# Patient Record
Sex: Female | Born: 1970 | Race: Black or African American | Hispanic: No | Smoking: Never smoker
Health system: Southern US, Community
[De-identification: ages and names within clinical notes are randomized; demographics above are authoritative.]

## PROBLEM LIST (undated history)

## (undated) DIAGNOSIS — J45909 Unspecified asthma, uncomplicated: Secondary | ICD-10-CM

## (undated) DIAGNOSIS — D649 Anemia, unspecified: Secondary | ICD-10-CM

## (undated) HISTORY — PX: BREAST SURGERY: SHX581

## (undated) HISTORY — PX: ABDOMINAL HYSTERECTOMY: SHX81

---

## 1997-06-08 ENCOUNTER — Inpatient Hospital Stay (HOSPITAL_COMMUNITY): Admission: AD | Admit: 1997-06-08 | Discharge: 1997-06-08 | Payer: Self-pay | Admitting: Obstetrics and Gynecology

## 1997-06-09 ENCOUNTER — Inpatient Hospital Stay (HOSPITAL_COMMUNITY): Admission: AD | Admit: 1997-06-09 | Discharge: 1997-06-09 | Payer: Self-pay | Admitting: Obstetrics and Gynecology

## 1997-06-26 ENCOUNTER — Inpatient Hospital Stay (HOSPITAL_COMMUNITY): Admission: AD | Admit: 1997-06-26 | Discharge: 1997-06-26 | Payer: Self-pay | Admitting: Obstetrics & Gynecology

## 1997-06-30 ENCOUNTER — Inpatient Hospital Stay (HOSPITAL_COMMUNITY): Admission: AD | Admit: 1997-06-30 | Discharge: 1997-07-02 | Payer: Self-pay | Admitting: Obstetrics and Gynecology

## 1997-07-09 ENCOUNTER — Observation Stay (HOSPITAL_COMMUNITY): Admission: AD | Admit: 1997-07-09 | Discharge: 1997-07-10 | Payer: Self-pay | Admitting: Obstetrics and Gynecology

## 1997-07-11 ENCOUNTER — Inpatient Hospital Stay (HOSPITAL_COMMUNITY): Admission: AD | Admit: 1997-07-11 | Discharge: 1997-07-21 | Payer: Self-pay | Admitting: Obstetrics & Gynecology

## 2013-04-09 ENCOUNTER — Emergency Department (HOSPITAL_COMMUNITY): Payer: No Typology Code available for payment source

## 2013-04-09 ENCOUNTER — Emergency Department (HOSPITAL_COMMUNITY)
Admission: EM | Admit: 2013-04-09 | Discharge: 2013-04-09 | Disposition: A | Discharge status: Home / Self Care | Payer: No Typology Code available for payment source | Attending: Emergency Medicine | Admitting: Emergency Medicine

## 2013-04-09 ENCOUNTER — Encounter (HOSPITAL_COMMUNITY): Payer: Self-pay | Admitting: Emergency Medicine

## 2013-04-09 DIAGNOSIS — S39012A Strain of muscle, fascia and tendon of lower back, initial encounter: Secondary | ICD-10-CM

## 2013-04-09 DIAGNOSIS — D649 Anemia, unspecified: Secondary | ICD-10-CM | POA: Insufficient documentation

## 2013-04-09 DIAGNOSIS — J45909 Unspecified asthma, uncomplicated: Secondary | ICD-10-CM | POA: Insufficient documentation

## 2013-04-09 DIAGNOSIS — Z79899 Other long term (current) drug therapy: Secondary | ICD-10-CM | POA: Insufficient documentation

## 2013-04-09 DIAGNOSIS — S139XXA Sprain of joints and ligaments of unspecified parts of neck, initial encounter: Secondary | ICD-10-CM | POA: Insufficient documentation

## 2013-04-09 DIAGNOSIS — Y92410 Unspecified street and highway as the place of occurrence of the external cause: Secondary | ICD-10-CM | POA: Insufficient documentation

## 2013-04-09 DIAGNOSIS — IMO0002 Reserved for concepts with insufficient information to code with codable children: Secondary | ICD-10-CM | POA: Insufficient documentation

## 2013-04-09 DIAGNOSIS — Y9389 Activity, other specified: Secondary | ICD-10-CM | POA: Insufficient documentation

## 2013-04-09 DIAGNOSIS — S0990XA Unspecified injury of head, initial encounter: Secondary | ICD-10-CM | POA: Insufficient documentation

## 2013-04-09 DIAGNOSIS — S161XXA Strain of muscle, fascia and tendon at neck level, initial encounter: Secondary | ICD-10-CM

## 2013-04-09 HISTORY — DX: Unspecified asthma, uncomplicated: J45.909

## 2013-04-09 HISTORY — DX: Anemia, unspecified: D64.9

## 2013-04-09 MED ORDER — DIAZEPAM 5 MG PO TABS
5.0000 mg | ORAL_TABLET | Freq: Once | ORAL | Status: AC
  Administered 2013-04-09: 5 mg via ORAL
  Filled 2013-04-09: qty 1

## 2013-04-09 MED ORDER — HYDROCODONE-ACETAMINOPHEN 5-325 MG PO TABS: 1.0000 | ORAL_TABLET | ORAL | Status: DC | PRN

## 2013-04-09 MED ORDER — KETOROLAC TROMETHAMINE 60 MG/2ML IM SOLN
60.0000 mg | Freq: Once | INTRAMUSCULAR | Status: AC
  Administered 2013-04-09: 60 mg via INTRAMUSCULAR
  Filled 2013-04-09: qty 2

## 2013-04-09 MED ORDER — DIAZEPAM 5 MG PO TABS: 5.0000 mg | ORAL_TABLET | Freq: Two times a day (BID) | ORAL | Status: DC

## 2013-04-09 NOTE — ED Notes (Signed)
Patient remains in Xray/CT still.

## 2013-04-09 NOTE — ED Notes (Signed)
Patient wants to hold off on Toradol at this time.

## 2013-04-09 NOTE — ED Notes (Signed)
Patient transported to X-ray 

## 2013-04-09 NOTE — ED Notes (Signed)
Patient returned from X-ray 
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
## 2013-04-10 NOTE — ED Provider Notes (Signed)
Medical screening examination/treatment/procedure(s) were performed by non-physician practitioner and as supervising physician I was immediately available for consultation/collaboration.  EKG Interpretation   None         Layla MawKristen N Madia Carvell, DO 04/10/13 [phone]

## 2021-03-10 ENCOUNTER — Ambulatory Visit: Payer: BC Managed Care – PPO | Admitting: Podiatry

## 2021-04-13 ENCOUNTER — Emergency Department (HOSPITAL_COMMUNITY): Payer: BC Managed Care – PPO

## 2021-04-13 ENCOUNTER — Emergency Department (HOSPITAL_COMMUNITY)
Admission: EM | Admit: 2021-04-13 | Discharge: 2021-04-13 | Disposition: A | Discharge status: Home / Self Care | Payer: BC Managed Care – PPO | Attending: Emergency Medicine | Admitting: Emergency Medicine

## 2021-04-13 ENCOUNTER — Other Ambulatory Visit: Payer: Self-pay

## 2021-04-13 DIAGNOSIS — Z91010 Allergy to peanuts: Secondary | ICD-10-CM | POA: Insufficient documentation

## 2021-04-13 DIAGNOSIS — H5710 Ocular pain, unspecified eye: Secondary | ICD-10-CM | POA: Diagnosis not present

## 2021-04-13 DIAGNOSIS — R519 Headache, unspecified: Secondary | ICD-10-CM | POA: Insufficient documentation

## 2021-04-13 DIAGNOSIS — J45909 Unspecified asthma, uncomplicated: Secondary | ICD-10-CM | POA: Insufficient documentation

## 2021-04-13 DIAGNOSIS — Z79899 Other long term (current) drug therapy: Secondary | ICD-10-CM | POA: Diagnosis not present

## 2021-04-13 LAB — CBC WITH DIFFERENTIAL/PLATELET
Abs Immature Granulocytes: 0.01 10*3/uL (ref 0.00–0.07)
Basophils Absolute: 0 10*3/uL (ref 0.0–0.1)
Basophils Relative: 1 %
Eosinophils Absolute: 0.1 10*3/uL (ref 0.0–0.5)
Eosinophils Relative: 2 %
HCT: 44.9 % (ref 36.0–46.0)
Hemoglobin: 14.1 g/dL (ref 12.0–15.0)
Immature Granulocytes: 0 %
Lymphocytes Relative: 50 %
Lymphs Abs: 3 10*3/uL (ref 0.7–4.0)
MCH: 29 pg (ref 26.0–34.0)
MCHC: 31.4 g/dL (ref 30.0–36.0)
MCV: 92.4 fL (ref 80.0–100.0)
Monocytes Absolute: 0.3 10*3/uL (ref 0.1–1.0)
Monocytes Relative: 6 %
Neutro Abs: 2.4 10*3/uL (ref 1.7–7.7)
Neutrophils Relative %: 41 %
Platelets: 367 10*3/uL (ref 150–400)
RBC: 4.86 MIL/uL (ref 3.87–5.11)
RDW: 13.2 % (ref 11.5–15.5)
WBC: 5.9 10*3/uL (ref 4.0–10.5)
nRBC: 0 % (ref 0.0–0.2)

## 2021-04-13 LAB — BASIC METABOLIC PANEL
Anion gap: 8 (ref 5–15)
BUN: 15 mg/dL (ref 6–20)
CO2: 26 mmol/L (ref 22–32)
Calcium: 9.2 mg/dL (ref 8.9–10.3)
Chloride: 103 mmol/L (ref 98–111)
Creatinine, Ser: 0.96 mg/dL (ref 0.44–1.00)
GFR, Estimated: >60 mL/min (ref >60)
Glucose, Bld: 92 mg/dL (ref 70–99)
Potassium: 5.7 mmol/L — ABNORMAL HIGH (ref 3.5–5.1)
Sodium: 137 mmol/L (ref 135–145)

## 2021-04-13 MED ORDER — KETOROLAC TROMETHAMINE 15 MG/ML IJ SOLN
15.0000 mg | Freq: Once | INTRAMUSCULAR | Status: AC
  Administered 2021-04-13: 15 mg via INTRAVENOUS
  Filled 2021-04-13: qty 1

## 2021-04-13 MED ORDER — DIPHENHYDRAMINE HCL 50 MG/ML IJ SOLN
25.0000 mg | Freq: Once | INTRAMUSCULAR | Status: AC
  Administered 2021-04-13: 25 mg via INTRAVENOUS
  Filled 2021-04-13: qty 1

## 2021-04-13 MED ORDER — SODIUM CHLORIDE 0.9 % IV BOLUS
500.0000 mL | Freq: Once | INTRAVENOUS | Status: AC
  Administered 2021-04-13: 500 mL via INTRAVENOUS

## 2021-04-13 MED ORDER — IOHEXOL 350 MG/ML SOLN
100.0000 mL | Freq: Once | INTRAVENOUS | Status: AC | PRN
  Administered 2021-04-13: 100 mL via INTRAVENOUS

## 2021-04-13 MED ORDER — SODIUM CHLORIDE 0.9 % IV BOLUS: 500.0000 mL | Freq: Once | INTRAVENOUS | Status: DC

## 2021-04-13 NOTE — ED Triage Notes (Signed)
Pt reports increasing head and neck pain since 1 AM and is unable to sleep.

## 2021-04-13 NOTE — ED Notes (Signed)
Pt transported to CT ?

## 2021-04-13 NOTE — ED Provider Notes (Signed)
[facility] EMERGENCY DEPARTMENT Provider Note   CSN: [id number] Arrival date & time: 04/13/21  [id number]     History  Chief Complaint  Patient presents with   Headache    Heather Bates is a 51 y.o. female with history significant for anemia, asthma, migraine headaches.  Patient presents to ED for evaluation of headache.  Patient states that at 1 AM she woke up with a headache and then again woke up at 4 AM with worsening pain/headache.  Patient states that the pain is located primarily in the left side of her head, states that the pain comes and goes and describes it as a "throb".  Patient has not attempted to alleviate the pain utilizing over-the-counter medications.  Patient is endorsing eye pain, headache.  Patient denies chest pain, shortness of breath, back pain, nausea, vomiting, weakness, numbness, syncope, fevers, neck stiffness, visual disturbance.   Headache Associated symptoms: eye pain and myalgias   Associated symptoms: no back pain, no fever, no nausea, no neck stiffness, no numbness, no vomiting and no weakness       Home Medications Prior to Admission medications   Medication Sig Start Date End Date Taking? Authorizing Provider  cetirizine (ZYRTEC) 10 MG tablet Take 10 mg by mouth at bedtime.   Yes [provider]  ferrous sulfate 325 (65 FE) MG tablet Take 325 mg by mouth daily with breakfast.   Yes [provider]  Multiple Vitamins-Minerals (ONE-A-DAY WOMENS PETITES) TABS Take 1 tablet by mouth daily.   Yes [provider]  diazepam (VALIUM) 5 MG tablet Take 1 tablet (5 mg total) by mouth 2 (two) times daily. Patient not taking: Reported on 04/13/2021 04/09/13   Carman Ching, PA-C  HYDROcodone-acetaminophen (NORCO/VICODIN) 5-325 MG per tablet Take 1-2 tablets by mouth every 4 (four) hours as needed. Patient not taking: Reported on 04/13/2021 04/09/13   Carman Ching, PA-C      Allergies    Peanuts [peanut oil] and  Promethazine    Review of Systems   Review of Systems  Constitutional:  Negative for fever.  Eyes:  Positive for pain. Negative for visual disturbance.  Respiratory:  Negative for shortness of breath.   Cardiovascular:  Negative for chest pain.  Gastrointestinal:  Negative for nausea, rectal pain and vomiting.  Musculoskeletal:  Positive for myalgias. Negative for back pain and neck stiffness.  Neurological:  Positive for headaches. Negative for syncope, weakness and numbness.  All other systems reviewed and are negative.  Physical Exam Updated Vital Signs BP 137/80    Pulse 71    Temp 98.4 F (36.9 C) (Oral)    Resp 16    Ht 5\' 3"  (1.6 m)    SpO2 100%    BMI 23.03 kg/m  Physical Exam  ED Results / Procedures / Treatments   Labs (all labs ordered are listed, but only abnormal results are displayed) Labs Reviewed  BASIC METABOLIC PANEL - Abnormal; Notable for the following components:      Result Value   Potassium 5.7 (*)    All other components within normal limits  CBC WITH DIFFERENTIAL/PLATELET  I-STAT BETA HCG BLOOD, ED ([facility], [facility], [facility] ONLY)    EKG None  Radiology CT ANGIO HEAD NECK W WO CM  Result Date: 04/13/2021 CLINICAL DATA:  Headache, sudden and severe. EXAM: CT ANGIOGRAPHY HEAD AND NECK TECHNIQUE: Multidetector CT imaging of the head and neck was performed using the standard protocol during bolus administration of intravenous contrast. Multiplanar  CT image reconstructions and MIPs were obtained to evaluate the vascular anatomy. Carotid stenosis measurements (when applicable) are obtained utilizing NASCET criteria, using the distal internal carotid diameter as the denominator. RADIATION DOSE REDUCTION: This exam was performed according to the departmental dose-optimization program which includes automated exposure control, adjustment of the mA and/or kV according to patient size and/or use of iterative reconstruction technique. CONTRAST:  145mL OMNIPAQUE IOHEXOL 350 MG/ML  SOLN COMPARISON:  Same day CT head. FINDINGS: CTA NECK FINDINGS Aortic arch: Partially imaged great vessel origins are patent. Right carotid system: No evidence of dissection, stenosis (50% or greater) or occlusion. Mild atherosclerosis at carotid bifurcation. Left carotid system: No evidence of dissection, stenosis (50% or greater) or occlusion. Mild atherosclerosis at the carotid bifurcation. Vertebral arteries: Left dominant. No evidence of dissection, stenosis (50% or greater) or occlusion. Skeleton: No evidence of acute abnormality on limited assessment. Other neck: Heterogeneously enhancing 2.3 cm nodule rising from the posterior right thyroid. Upper chest: Mild biapical pleuroparenchymal scarring. Otherwise, visualized lung apices are clear. Review of the MIP images confirms the above findings CTA HEAD FINDINGS Anterior circulation: Bilateral intracranial ICAs, MCAs, and ACAs are patent without proximal hemodynamically significant stenosis. No aneurysm identified. Posterior circulation: Left dominant vertebral artery. Bilateral intradural vertebral arteries, basilar artery and posterior cerebral arteries patent without proximal hemodynamically significant stenosis. No aneurysm identified. Venous sinuses: As permitted by contrast timing, patent. Anatomic variants: Detailed above. Review of the MIP images confirms the above findings IMPRESSION: 1. No evidence of large vessel occlusion, proximal hemodynamically significant stenosis, or aneurysm. 2. Approximately 2.3 cm right thyroid nodule. Recommend non-urgent thyroid ultrasound (ref: J Am Coll Radiol. 2015 Feb;12(2): 143-50). Electronically Signed   By: Margaretha Sheffield M.D.   On: 04/13/2021 13:21   CT Head Wo Contrast  Result Date: 04/13/2021 CLINICAL DATA:  Headache, severe. EXAM: CT HEAD WITHOUT CONTRAST TECHNIQUE: Contiguous axial images were obtained from the base of the skull through the vertex without intravenous contrast. RADIATION DOSE REDUCTION:  This exam was performed according to the departmental dose-optimization program which includes automated exposure control, adjustment of the mA and/or kV according to patient size and/or use of iterative reconstruction technique. COMPARISON:  04/09/2013 FINDINGS: Brain: No evidence of acute infarction, hemorrhage, hydrocephalus, extra-axial collection or mass lesion/mass effect. Vascular: No hyperdense vessel or unexpected calcification. Skull: Normal. Negative for fracture or focal lesion. Sinuses/Orbits: No acute finding. Other: None. IMPRESSION: No acute intracranial abnormalities. Normal brain. Electronically Signed   By: Kerby Moors M.D.   On: 04/13/2021 09:31    Procedures Procedures    Medications Ordered in ED Medications  sodium chloride 0.9 % bolus 500 mL (500 mLs Intravenous Patient Refused/Not Given 04/13/21 1120)  diphenhydrAMINE (BENADRYL) injection 25 mg (25 mg Intravenous Given 04/13/21 0856)  ketorolac (TORADOL) 15 MG/ML injection 15 mg (15 mg Intravenous Given 04/13/21 0856)  sodium chloride 0.9 % bolus 500 mL (0 mLs Intravenous Stopped 04/13/21 1005)  iohexol (OMNIPAQUE) 350 MG/ML injection 100 mL (100 mLs Intravenous Contrast Given 04/13/21 1244)    ED Course/ Medical Decision Making/ A&P Clinical Course as of 04/13/21 1342  Sun Apr 13, 2021  1333 Basophil: 1 [CG]    Clinical Course User Index [CG] Azucena Cecil, PA-C                           Medical Decision Making Amount and/or Complexity of Data Reviewed Labs: ordered. Radiology: ordered.  Risk Prescription drug management.  51 year old female presents for evaluation of headache that woke her from her sleep this morning at 1 AM.  Patient has history of recurrent migraines, has not attempted to alleviate any of her symptoms utilizing medications.  On examination, the patient is afebrile, nontachycardic, not hypoxic, clear lung sounds bilaterally, soft compressible abdomen.  Her neurological exam shows no  focal neurodeficits.  She is alert and oriented x4.  Patient reports that this headache is worse in nature than past headaches however she cannot specify how.  Due to this, I made the decision to CT scan this patient's head.  Patient will be worked up utilizing following labs and imaging studies interpreted by me: CBC, BMP, CT head, CT angio head CBC unremarkable BMP unremarkable.  Potassium result of 5.7 is in air.  Per lab, sample was hemolyzed.  Patient EKG conducted, did not show peaked T waves. CT head shows no mass effect, herniation, intracranial abnormality or bleed  At this time, the patient's work-up had been largely reassuring.  The patient had been treated with 15 mg Toradol, 25 mg Benadryl and 500 mL of saline.  After these treatments, the patient reported that her headache had not subsided.  I discussed these findings with my attending Dr. Dina Rich, and we decided that a CT angiogram of this patient's head was necessary due to the fact she had never had this imaging study done.  IV team was paged in order to gain access on this patient as she is historically a hard stick and in order to conduct CT angiogram she required a 20-gauge IV which she did not have placed yet.  Patient became irritated at the amount of time it was taking for IV team to come down, patient also refused any additional medications that would be administered in order to alleviate headache.  CT angio showed no large vessel occlusion, stenosis or aneurysm.  CT imaging did show 2.3 cm thyroid nodule on the right.  Suggested nonemergent ultrasound as an outpatient.  At this time, after discussion with my attending Dr. Dina Rich, we feel this patient is stable for discharge.  The patient states that her headache has subsided.  We will advise the patient to follow-up with her PCP for outpatient thyroid ultrasound.  The patient was given return precautions and she voiced understanding.  The patient had all of her questions answered  to her satisfaction.  The patient is stable at discharge.   Final Clinical Impression(s) / ED Diagnoses Final diagnoses:  Nonintractable headache, unspecified chronicity pattern, unspecified headache type    Rx / DC Orders ED Discharge Orders     None         Azucena Cecil, PA-C 04/13/21 1344    Horton, Alvin Critchley, DO 04/13/21 1554

## 2021-04-13 NOTE — ED Notes (Signed)
Pt tx to CT.

## 2021-04-13 NOTE — ED Notes (Signed)
Patient verbalizes understanding of discharge instructions. Opportunity for questioning and answers were provided. Armband removed by staff, pt discharged from ED and ambulated to lobby to return home.   

## 2021-04-13 NOTE — Discharge Instructions (Signed)
Return to ED with any new symptoms such as headache, shortness of breath, chest pain, back pain, blurred vision Please follow-up with your PCP as we discussed.  You will need an outpatient ultrasound of your thyroid Please continue to remain hydrated

## 2021-05-10 ENCOUNTER — Encounter (HOSPITAL_COMMUNITY): Payer: Self-pay | Admitting: Radiology

## 2021-06-17 ENCOUNTER — Emergency Department (HOSPITAL_COMMUNITY): Payer: BC Managed Care – PPO

## 2021-06-17 ENCOUNTER — Other Ambulatory Visit: Payer: Self-pay

## 2021-06-17 ENCOUNTER — Emergency Department (HOSPITAL_COMMUNITY)
Admission: EM | Admit: 2021-06-17 | Discharge: 2021-06-17 | Disposition: A | Discharge status: Home / Self Care | Payer: BC Managed Care – PPO | Attending: Emergency Medicine | Admitting: Emergency Medicine

## 2021-06-17 ENCOUNTER — Encounter (HOSPITAL_COMMUNITY): Payer: Self-pay | Admitting: Emergency Medicine

## 2021-06-17 DIAGNOSIS — R21 Rash and other nonspecific skin eruption: Secondary | ICD-10-CM | POA: Insufficient documentation

## 2021-06-17 DIAGNOSIS — B029 Zoster without complications: Secondary | ICD-10-CM | POA: Insufficient documentation

## 2021-06-17 DIAGNOSIS — Z91010 Allergy to peanuts: Secondary | ICD-10-CM | POA: Insufficient documentation

## 2021-06-17 DIAGNOSIS — M546 Pain in thoracic spine: Secondary | ICD-10-CM | POA: Insufficient documentation

## 2021-06-17 DIAGNOSIS — R911 Solitary pulmonary nodule: Secondary | ICD-10-CM | POA: Diagnosis not present

## 2021-06-17 LAB — BASIC METABOLIC PANEL
Anion gap: 6 (ref 5–15)
BUN: 13 mg/dL (ref 6–20)
CO2: 24 mmol/L (ref 22–32)
Calcium: 9.4 mg/dL (ref 8.9–10.3)
Chloride: 107 mmol/L (ref 98–111)
Creatinine, Ser: 0.92 mg/dL (ref 0.44–1.00)
GFR, Estimated: >60 mL/min (ref >60)
Glucose, Bld: 101 mg/dL — ABNORMAL HIGH (ref 70–99)
Potassium: 4.3 mmol/L (ref 3.5–5.1)
Sodium: 137 mmol/L (ref 135–145)

## 2021-06-17 LAB — CBC WITH DIFFERENTIAL/PLATELET
Abs Immature Granulocytes: 0.02 10*3/uL (ref 0.00–0.07)
Basophils Absolute: 0 10*3/uL (ref 0.0–0.1)
Basophils Relative: 0 %
Eosinophils Absolute: 0.1 10*3/uL (ref 0.0–0.5)
Eosinophils Relative: 1 %
HCT: 41.3 % (ref 36.0–46.0)
Hemoglobin: 13.4 g/dL (ref 12.0–15.0)
Immature Granulocytes: 0 %
Lymphocytes Relative: 48 %
Lymphs Abs: 4.1 10*3/uL — ABNORMAL HIGH (ref 0.7–4.0)
MCH: 29.3 pg (ref 26.0–34.0)
MCHC: 32.4 g/dL (ref 30.0–36.0)
MCV: 90.2 fL (ref 80.0–100.0)
Monocytes Absolute: 0.7 10*3/uL (ref 0.1–1.0)
Monocytes Relative: 8 %
Neutro Abs: 3.8 10*3/uL (ref 1.7–7.7)
Neutrophils Relative %: 43 %
Platelets: 375 10*3/uL (ref 150–400)
RBC: 4.58 MIL/uL (ref 3.87–5.11)
RDW: 13 % (ref 11.5–15.5)
WBC: 8.7 10*3/uL (ref 4.0–10.5)
nRBC: 0 % (ref 0.0–0.2)

## 2021-06-17 LAB — TROPONIN I (HIGH SENSITIVITY)
Troponin I (High Sensitivity): 4 ng/L (ref <18)
Troponin I (High Sensitivity): 5 ng/L (ref <18)

## 2021-06-17 LAB — D-DIMER, QUANTITATIVE: D-Dimer, Quant: 1.17 ug/mL-FEU — ABNORMAL HIGH (ref 0.00–0.50)

## 2021-06-17 MED ORDER — IOHEXOL 350 MG/ML SOLN
55.0000 mL | Freq: Once | INTRAVENOUS | Status: AC | PRN
  Administered 2021-06-17: 55 mL via INTRAVENOUS

## 2021-06-17 MED ORDER — METHOCARBAMOL 500 MG PO TABS: 500.0000 mg | ORAL_TABLET | Freq: Two times a day (BID) | ORAL | 0 refills | Status: DC

## 2021-06-17 MED ORDER — NAPROXEN 500 MG PO TABS: 500.0000 mg | ORAL_TABLET | Freq: Two times a day (BID) | ORAL | 0 refills | Status: DC

## 2021-06-17 MED ORDER — OXYCODONE-ACETAMINOPHEN 5-325 MG PO TABS
1.0000 | ORAL_TABLET | Freq: Once | ORAL | Status: AC
  Administered 2021-06-17: 1 via ORAL
  Filled 2021-06-17: qty 1

## 2021-06-17 MED ORDER — MORPHINE SULFATE (PF) 4 MG/ML IV SOLN
4.0000 mg | Freq: Once | INTRAVENOUS | Status: AC
  Administered 2021-06-17: 4 mg via INTRAVENOUS
  Filled 2021-06-17: qty 1

## 2021-06-17 NOTE — ED Provider Notes (Signed)
?[facility] EMERGENCY DEPARTMENT ?Provider Note ? ? ?CSN: [id number] ?Arrival date & time: 06/17/21  1428 ? ?  ? ?History ? ?Chief Complaint  ?Patient presents with  ? Back Pain  ? ? ?Heather Bates is a 51 y.o. female who presents to the ED today with complaint of gradual onset, constant, worsening, sharp, mid back pain that began yesterday.  Patient reports the pain is worse with deep inspiration however also worse when laying directly on her back.  She denies any trauma or heavy lifting.  She does mention that she is currently on valacyclovir for shingles to her right shoulder/neck area.  She also complains of shortness of breath. No nausea, vomiting, diaphoresis.  Patient is a never smoker.  History abdominal hysterectomy. No other complaints at this time.  ? ?The history is provided by the patient and medical records.  ? ?  ? ?Home Medications ?Prior to Admission medications   ?Medication Sig Start Date End Date Taking? Authorizing Provider  ?methocarbamol (ROBAXIN) 500 MG tablet Take 1 tablet (500 mg total) by mouth 2 (two) times daily. 06/17/21  Yes Caleen Taaffe, PA-C  ?naproxen (NAPROSYN) 500 MG tablet Take 1 tablet (500 mg total) by mouth 2 (two) times daily. 06/17/21  Yes Mahalie Kanner, PA-C  ?cetirizine (ZYRTEC) 10 MG tablet Take 10 mg by mouth at bedtime.    [provider]  ?diazepam (VALIUM) 5 MG tablet Take 1 tablet (5 mg total) by mouth 2 (two) times daily. ?Patient not taking: Reported on 04/13/2021 04/09/13   Kathrynn Speed, PA-C  ?ferrous sulfate 325 (65 FE) MG tablet Take 325 mg by mouth daily with breakfast.    [provider]  ?HYDROcodone-acetaminophen (NORCO/VICODIN) 5-325 MG per tablet Take 1-2 tablets by mouth every 4 (four) hours as needed. ?Patient not taking: Reported on 04/13/2021 04/09/13   Kathrynn Speed, PA-C  ?Multiple Vitamins-Minerals (ONE-A-DAY WOMENS PETITES) TABS Take 1 tablet by mouth daily.    [provider]  ?   ? ?Allergies     ?Peanuts [peanut oil] and Promethazine   ? ?Review of Systems   ?Review of Systems  ?Constitutional:  Negative for chills and fever.  ?Respiratory:  Positive for shortness of breath. Negative for cough.   ?Cardiovascular:  Negative for chest pain, palpitations and leg swelling.  ?Gastrointestinal:  Negative for nausea and vomiting.  ?Musculoskeletal:  Positive for back pain.  ?All other systems reviewed and are negative. ? ?Physical Exam ?Updated Vital Signs ?BP (!) 129/92 (BP Location: Right Arm)   Pulse 75   Temp 99.1 ?F (37.3 ?C) (Oral)   Resp (!) 21   Ht 5\' 3"  (1.6 m)   Wt 63.5 kg   LMP 04/02/2013   SpO2 100%   BMI 24.80 kg/m?  ?Physical Exam ?Vitals and nursing note reviewed.  ?Constitutional:   ?   Appearance: She is not ill-appearing or diaphoretic.  ?   Comments: Appears uncomfortable on exam  ?HENT:  ?   Head: Normocephalic and atraumatic.  ?Eyes:  ?   Conjunctiva/sclera: Conjunctivae normal.  ?Cardiovascular:  ?   Rate and Rhythm: Normal rate and regular rhythm.  ?   Pulses: Normal pulses.  ?Pulmonary:  ?   Effort: Pulmonary effort is normal.  ?   Breath sounds: Normal breath sounds. No wheezing, rhonchi or rales.  ?Chest:  ?   Chest wall: No tenderness.  ?Abdominal:  ?   Palpations: Abdomen is soft.  ?   Tenderness: There is no  abdominal tenderness.  ?Musculoskeletal:  ?   Cervical back: Neck supple.  ?   Comments: Mild thoracic midline spinal TTP with associated left parathoracic musculature TTP.   ?Skin: ?   General: Skin is warm and dry.  ?   Comments: Vesicular rash to R trapezius area  ?Neurological:  ?   Mental Status: She is alert.  ? ? ?ED Results / Procedures / Treatments   ?Labs ?(all labs ordered are listed, but only abnormal results are displayed) ?Labs Reviewed  ?CBC WITH DIFFERENTIAL/PLATELET - Abnormal; Notable for the following components:  ?    Result Value  ? Lymphs Abs 4.1 (*)   ? All other components within normal limits  ?BASIC METABOLIC PANEL - Abnormal; Notable for the  following components:  ? Glucose, Bld 101 (*)   ? All other components within normal limits  ?D-DIMER, QUANTITATIVE - Abnormal; Notable for the following components:  ? D-Dimer, Quant 1.17 (*)   ? All other components within normal limits  ?TROPONIN I (HIGH SENSITIVITY)  ?TROPONIN I (HIGH SENSITIVITY)  ? ? ?EKG ?EKG Interpretation ? ?Date/Time:  Tuesday June 17 2021 16:49:02 EDT ?Ventricular Rate:  70 ?PR Interval:  126 ?QRS Duration: 74 ?QT Interval:  393 ?QTC Calculation: 424 ?R Axis:   45 ?Text Interpretation: Sinus rhythm Ventricular premature complex Probable left atrial enlargement Low voltage, precordial leads since last tracing no significant change Confirmed by Malvin Johns [phone]) on 06/17/2021 4:52:43 PM ? ?Radiology ?DG Chest 2 View ? ?Result Date: 06/17/2021 ?CLINICAL DATA:  Back pain starting last night.  Shortness of breath. EXAM: CHEST - 2 VIEW COMPARISON:  04/09/2013 FINDINGS: The heart size and mediastinal contours are within normal limits. Both lungs are clear. The visualized skeletal structures are unremarkable. IMPRESSION: No active cardiopulmonary disease. Electronically Signed   By: Van Clines M.D.   On: 06/17/2021 17:40  ? ?DG Thoracic Spine 2 View ? ?Result Date: 06/17/2021 ?CLINICAL DATA:  Back pain starting last night EXAM: THORACIC SPINE 2 VIEWS COMPARISON:  04/09/2013 FINDINGS: There is no evidence of thoracic spine fracture. Alignment is normal. No other significant bone abnormalities are identified. IMPRESSION: Negative. Electronically Signed   By: Van Clines M.D.   On: 06/17/2021 17:39  ? ?CT Angio Chest PE W/Cm &/Or Wo Cm ? ?Result Date: 06/17/2021 ?CLINICAL DATA:  Pulmonary embolism suspected, positive D-dimer. Back pain. EXAM: CT ANGIOGRAPHY CHEST WITH CONTRAST TECHNIQUE: Multidetector CT imaging of the chest was performed using the standard protocol during bolus administration of intravenous contrast. Multiplanar CT image reconstructions and MIPs were obtained to  evaluate the vascular anatomy. RADIATION DOSE REDUCTION: This exam was performed according to the departmental dose-optimization program which includes automated exposure control, adjustment of the mA and/or kV according to patient size and/or use of iterative reconstruction technique. CONTRAST:  24mL OMNIPAQUE IOHEXOL 350 MG/ML SOLN COMPARISON:  None. FINDINGS: Cardiovascular: The heart is normal in size and there is no pericardial effusion. A few scattered coronary artery calcifications are noted. The aorta and pulmonary trunk are normal in caliber. No pulmonary artery filling defect is identified. Mediastinum/Nodes: No mediastinal, hilar, or axillary lymphadenopathy. The thyroid gland, trachea, and esophagus are within normal limits. Lungs/Pleura: Mild atelectasis is noted at the lung bases. No effusion or pneumothorax. A 4 mm nodule is present in the left lower lobe, axial image 65. There is a 3 mm nodule in the right upper lobe, axial image 51. Upper Abdomen: The gallbladder is surgically absent. No acute abnormality. Musculoskeletal: No chest wall  abnormality. No acute or significant osseous findings. Review of the MIP images confirms the above findings. IMPRESSION: 1. No evidence of pulmonary embolism or other acute process. 2. Left lower lobe and right upper lobe pulmonary nodules. No follow-up needed if patient is low-risk (and has no known or suspected primary neoplasm). Non-contrast chest CT can be considered in 12 months if patient is high-risk. This recommendation follows the consensus statement: Guidelines for Management of Incidental Pulmonary Nodules Detected on CT Images: From the Fleischner Society 2017; Radiology 2017; 284:228-243. 3. Coronary artery calcification Electronically Signed   By: Brett Fairy M.D.   On: 06/17/2021 22:13   ? ?Procedures ?Procedures  ? ? ?Medications Ordered in ED ?Medications  ?morphine (PF) 4 MG/ML injection 4 mg (4 mg Intravenous Given 06/17/21 1806)  ?iohexol  (OMNIPAQUE) 350 MG/ML injection 55 mL (55 mLs Intravenous Contrast Given 06/17/21 2156)  ? ? ?ED Course/ Medical Decision Making/ A&P ?  ?                        ?Medical Decision Making ?51 year old female presents to the ED toda

## 2021-06-17 NOTE — Discharge Instructions (Signed)
Your workup was overall reassuring in the ED today. Your CT scan did show an incidental finding of pulmonary nodules on both the left and right lungs. It is recommended that you follow up with your PCP for further evaluation as you may need repeat imaging in a years time.  ? ?Pick up medication and take as prescribed. DO NOT DRIVE OR DRINK ALCOHOL WHILE ON THE MUSCLE RELAXERS AS IT CAN MAKE YOU DROWSY. You can also buy OTC Salon Pas patches to the back to help with pain.  ? ?Return to the ED for any new/worsening symptoms ?

## 2021-06-17 NOTE — ED Triage Notes (Signed)
Per [facility] pt reports being diagnosed with shingles last Wednesday. C/o back pain over the past two days. States when got up to go to bathroom pain was severe and called for help.  ?

## 2021-06-17 NOTE — ED Provider Triage Note (Signed)
Emergency Medicine Provider Triage Evaluation Note ? ?Heather Bates , a 51 y.o. female  was evaluated in triage.  Pt complains of back pain since last night.  Patient states back pain is located in her mid back.  Denies radiation.  Patient also endorsing shortness of breath.  Patient states she was recently diagnosed with shingles, has been compliant on antiviral medication.  Patient denies any recent trauma, injury, falls. ? ?Review of Systems  ?Positive:  ?Negative:  ? ?Physical Exam  ?BP 120/88 (BP Location: Left Arm)   Pulse 92   Temp 99.1 ?F (37.3 ?C) (Oral)   Resp 16   Ht 5\' 3"  (1.6 m)   Wt 63.5 kg   LMP 04/02/2013   SpO2 100%   BMI 24.80 kg/m?  ?Gen:   Awake, no distress   ?Resp:  Normal effort  ?MSK:   Moves extremities without difficulty  ?Other:  Diffuse thoracic pain. ? ?Medical Decision Making  ?Medically screening exam initiated at 2:46 PM.  Appropriate orders placed.  Heather Bates was informed that the remainder of the evaluation will be completed by another provider, this initial triage assessment does not replace that evaluation, and the importance of remaining in the ED until their evaluation is complete. ? ? ?  ?Verlon Au, PA-C ?06/17/21 1446 ? ?

## 2022-09-25 ENCOUNTER — Ambulatory Visit (HOSPITAL_COMMUNITY)
Admission: EM | Admit: 2022-09-25 | Discharge: 2022-09-25 | Disposition: A | Discharge status: Home / Self Care | Payer: Managed Care, Other (non HMO) | Attending: Emergency Medicine | Admitting: Emergency Medicine

## 2022-09-25 ENCOUNTER — Encounter (HOSPITAL_COMMUNITY): Payer: Self-pay

## 2022-09-25 DIAGNOSIS — M545 Low back pain, unspecified: Secondary | ICD-10-CM

## 2022-09-25 MED ORDER — NAPROXEN 500 MG PO TABS: 500.0000 mg | ORAL_TABLET | Freq: Two times a day (BID) | ORAL | 0 refills | Status: AC

## 2022-09-25 MED ORDER — METHOCARBAMOL 500 MG PO TABS: 500.0000 mg | ORAL_TABLET | Freq: Two times a day (BID) | ORAL | 0 refills | Status: AC

## 2022-09-25 NOTE — ED Provider Notes (Signed)
[facility]    CSN: [id number] Arrival date & time: 09/25/22  0944      History   Chief Complaint Chief Complaint  Patient presents with   Back Pain    HPI Heather Bates is a 52 y.o. female.   Patient presents to clinic for acute left sided low back pain that started at work earlier today. She works for [employer] and was lifting some heavy boxes today when she felt the sudden pain. She used some Biofreeze at work. Her work is strenuous and she has been fatigued. Has been picking up extra shifts.   She denies any weakness, numbness, or tingling.   Pain increases with bending and twisting. No falls or trauma.    The history is provided by the patient and medical records.  Back Pain Associated symptoms: no numbness and no weakness     Past Medical History:  Diagnosis Date   Anemia    Asthma     There are no problems to display for this patient.   Past Surgical History:  Procedure Laterality Date   ABDOMINAL HYSTERECTOMY     BREAST SURGERY      OB History   No obstetric history on file.      Home Medications    Prior to Admission medications   Medication Sig Start Date End Date Taking? Authorizing Provider  cetirizine (ZYRTEC) 10 MG tablet Take 10 mg by mouth at bedtime.   Yes [provider]  ferrous sulfate 325 (65 FE) MG tablet Take 325 mg by mouth daily with breakfast.   Yes [provider]  methocarbamol (ROBAXIN) 500 MG tablet Take 1 tablet (500 mg total) by mouth 2 (two) times daily. 09/25/22  Yes Rinaldo Ratel, [place], FNP  Multiple Vitamins-Minerals (ONE-A-DAY WOMENS PETITES) TABS Take 1 tablet by mouth daily.   Yes [provider]  naproxen (NAPROSYN) 500 MG tablet Take 1 tablet (500 mg total) by mouth 2 (two) times daily. 09/25/22  Yes Kayo Zion, [place], FNP    Family History History reviewed. No pertinent family history.  Social History Social History   Tobacco Use   Smoking status: Never  Substance Use Topics    Alcohol use: No   Drug use: No     Allergies   Peanuts [peanut oil] and Promethazine   Review of Systems Review of Systems  Musculoskeletal:  Positive for back pain. Negative for gait problem.  Neurological:  Negative for weakness and numbness.     Physical Exam Triage Vital Signs ED Triage Vitals  Encounter Vitals Group     BP 09/25/22 1000 137/88     Systolic BP Percentile --      Diastolic BP Percentile --      Pulse Rate 09/25/22 1000 65     Resp 09/25/22 1000 16     Temp 09/25/22 1000 98.4 F (36.9 C)     Temp Source 09/25/22 1000 Oral     SpO2 09/25/22 1000 97 %     Weight 09/25/22 1000 160 lb (72.6 kg)     Height 09/25/22 1000 5\' 2"  (1.575 m)     Head Circumference --      Peak Flow --      Pain Score 09/25/22 0959 7     Pain Loc --      Pain Education --      Exclude from Growth Chart --    No data found.  Updated Vital Signs BP 137/88 (BP Location: Left Arm)  Pulse 65   Temp 98.4 F (36.9 C) (Oral)   Resp 16   Ht 5\' 2"  (1.575 m)   Wt 160 lb (72.6 kg)   LMP 04/02/2013   SpO2 97%   BMI 29.26 kg/m   Visual Acuity Right Eye Distance:   Left Eye Distance:   Bilateral Distance:    Right Eye Near:   Left Eye Near:    Bilateral Near:     Physical Exam Vitals and nursing note reviewed.  Constitutional:      Appearance: Normal appearance.  HENT:     Head: Normocephalic and atraumatic.     Right Ear: External ear normal.     Left Ear: External ear normal.     Nose: Nose normal.     Mouth/Throat:     Mouth: Mucous membranes are moist.  Eyes:     Conjunctiva/sclera: Conjunctivae normal.  Cardiovascular:     Rate and Rhythm: Normal rate.  Pulmonary:     Effort: Pulmonary effort is normal. No respiratory distress.  Musculoskeletal:        General: Tenderness and signs of injury present. No swelling or deformity.     Cervical back: Normal.     Thoracic back: Normal.     Lumbar back: Tenderness present. Decreased range of motion.        Back:     Comments: Lumbar TTP and pain elicited with ROM. Denies numbness, tingling, no weakness.   Skin:    General: Skin is warm and dry.  Neurological:     General: No focal deficit present.     Mental Status: She is alert and oriented to person, place, and time.  Psychiatric:        Mood and Affect: Mood normal.        Behavior: Behavior normal. Behavior is cooperative.      UC Treatments / Results  Labs (all labs ordered are listed, but only abnormal results are displayed) Labs Reviewed - No data to display  EKG   Radiology No results found.  Procedures Procedures (including critical care time)  Medications Ordered in UC Medications - No data to display  Initial Impression / Assessment and Plan / UC Course  I have reviewed the triage vital signs and the nursing notes.  Pertinent labs & imaging results that were available during my care of the patient were reviewed by me and considered in my medical decision making (see chart for details).  Vitals and triage reviewed, patient is hemodynamically stable.  Musculoskeletal lumbar back pain, suspect a low back strain from improper lifting posture at work.  Without red flag symptoms of cauda equina, numbness, tingling or weakness.  Will trial anti-inflammatories and muscle relaxers, orthopedic follow-up if needed.  Plan of care, follow-up care and return precautions given, no questions at this time.     Final Clinical Impressions(s) / UC Diagnoses   Final diagnoses:  Acute left-sided low back pain without sciatica     Discharge Instructions      I suspect you have a lumbar strain.  Please rest, heat, take the anti-inflammatories and the muscle relaxers as needed. Do not drink or drive on the muscle relaxer as it may cause drowsiness. Ensure proper lifting posture when you return to work.   Return to clinic for new or urgent symptoms. Follow-up with an orthopedic if your back pain persists despite these  interventions.      ED Prescriptions     Medication Sig Dispense Auth. Provider  methocarbamol (ROBAXIN) 500 MG tablet Take 1 tablet (500 mg total) by mouth 2 (two) times daily. 20 tablet Rinaldo Ratel, [place], [place]   naproxen (NAPROSYN) 500 MG tablet Take 1 tablet (500 mg total) by mouth 2 (two) times daily. 30 tablet Kharon Hixon, [place], [place]      PDMP not reviewed this encounter.   Damarion Mendizabal, [place], [place] 09/25/22 1024

## 2022-09-25 NOTE — ED Triage Notes (Signed)
Patient here today with c/o LB pain that started this morning at work. Patient works at [employer]. She was moving boxes and felt a pop in her back when she picked up a box.

## 2022-09-25 NOTE — Discharge Instructions (Addendum)
I suspect you have a lumbar strain.  Please rest, heat, take the anti-inflammatories and the muscle relaxers as needed. Do not drink or drive on the muscle relaxer as it may cause drowsiness. Ensure proper lifting posture when you return to work.   Return to clinic for new or urgent symptoms. Follow-up with an orthopedic if your back pain persists despite these interventions.

## 2022-10-07 ENCOUNTER — Ambulatory Visit (HOSPITAL_COMMUNITY)
Admission: EM | Admit: 2022-10-07 | Discharge: 2022-10-07 | Disposition: A | Discharge status: Home / Self Care | Payer: Managed Care, Other (non HMO) | Attending: Emergency Medicine | Admitting: Emergency Medicine

## 2022-10-07 ENCOUNTER — Encounter (HOSPITAL_COMMUNITY): Payer: Self-pay | Admitting: *Deleted

## 2022-10-07 DIAGNOSIS — M545 Low back pain, unspecified: Secondary | ICD-10-CM | POA: Diagnosis not present

## 2022-10-07 MED ORDER — PREDNISONE 20 MG PO TABS
ORAL_TABLET | ORAL | Status: AC
  Filled 2022-10-07: qty 2

## 2022-10-07 MED ORDER — PREDNISONE 20 MG PO TABS: 40.0000 mg | ORAL_TABLET | Freq: Every day | ORAL | 0 refills | Status: AC

## 2022-10-07 MED ORDER — PREDNISONE 20 MG PO TABS
40.0000 mg | ORAL_TABLET | Freq: Once | ORAL | Status: AC
  Administered 2022-10-07: 40 mg via ORAL

## 2022-10-07 NOTE — Discharge Instructions (Addendum)
Please continue the use of muscle relaxers, anti-inflammatories and activity modification. We have started a steroid that can help with pain and inflammation as well. If you pain returns or persists, please follow-up with an orthopedic for further evaluation.   Seek immediate care if you develop incontinence, inner leg numbness or inability to walk.

## 2022-10-07 NOTE — ED Triage Notes (Signed)
Pt states she was seen on 09/25/2022 for back pain and she took those days off and she took the meds but now that she is back at work her back hurts again. 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

## 2022-10-07 NOTE — ED Provider Notes (Signed)
[facility]    CSN: [id number] Arrival date & time: 10/07/22  1104      History   Chief Complaint Chief Complaint  Patient presents with   Back Pain    HPI Heather Bates is a 52 y.o. female.   Patient presents to clinic for continued left-sided lower back pain.  She was seen and evaluated at this clinic previously and started on anti-inflammatories and muscle relaxers and modified her activity at work, this helped.  She had to re-start the heavy lifting and pushing boxes again, and this triggered her back pain again.   Denies numbness, tingling or incontinence. No falls or trauma.   Her job requires her to lift over 50 lbs.   Has not followed up with orthopedics.     The history is provided by the patient and medical records.  Back Pain   Past Medical History:  Diagnosis Date   Anemia    Asthma     There are no problems to display for this patient.   Past Surgical History:  Procedure Laterality Date   ABDOMINAL HYSTERECTOMY     BREAST SURGERY      OB History   No obstetric history on file.      Home Medications    Prior to Admission medications   Medication Sig Start Date End Date Taking? Authorizing Provider  cetirizine (ZYRTEC) 10 MG tablet Take 10 mg by mouth at bedtime.   Yes [provider]  ferrous sulfate 325 (65 FE) MG tablet Take 325 mg by mouth daily with breakfast.   Yes [provider]  Multiple Vitamins-Minerals (ONE-A-DAY WOMENS PETITES) TABS Take 1 tablet by mouth daily.   Yes [provider]  predniSONE (DELTASONE) 20 MG tablet Take 2 tablets (40 mg total) by mouth daily with breakfast for 4 days. 10/07/22 10/11/22 Yes Rinaldo Ratel, [place], FNP  methocarbamol (ROBAXIN) 500 MG tablet Take 1 tablet (500 mg total) by mouth 2 (two) times daily. 09/25/22   Naima Veldhuizen, [place], FNP  naproxen (NAPROSYN) 500 MG tablet Take 1 tablet (500 mg total) by mouth 2 (two) times daily. 09/25/22   Antoinette Borgwardt, [place], FNP     Family History History reviewed. No pertinent family history.  Social History Social History   Tobacco Use   Smoking status: Never  Substance Use Topics   Alcohol use: No   Drug use: No     Allergies   Peanuts [peanut oil] and Promethazine   Review of Systems Review of Systems  Musculoskeletal:  Positive for back pain. Negative for gait problem.     Physical Exam Triage Vital Signs ED Triage Vitals  Encounter Vitals Group     BP 10/07/22 1237 (!) 141/91     Systolic BP Percentile --      Diastolic BP Percentile --      Pulse Rate 10/07/22 1237 72     Resp 10/07/22 1237 18     Temp 10/07/22 1237 98.1 F (36.7 C)     Temp Source 10/07/22 1237 Oral     SpO2 10/07/22 1237 98 %     Weight --      Height --      Head Circumference --      Peak Flow --      Pain Score 10/07/22 1236 7     Pain Loc --      Pain Education --      Exclude from Growth Chart --    No  data found.  Updated Vital Signs BP (!) 141/91 (BP Location: Left Arm)   Pulse 72   Temp 98.1 F (36.7 C) (Oral)   Resp 18   LMP 04/02/2013   SpO2 98%   Visual Acuity Right Eye Distance:   Left Eye Distance:   Bilateral Distance:    Right Eye Near:   Left Eye Near:    Bilateral Near:     Physical Exam Vitals and nursing note reviewed.  Constitutional:      Appearance: Normal appearance.  HENT:     Head: Normocephalic and atraumatic.     Right Ear: External ear normal.     Left Ear: External ear normal.     Nose: Nose normal.     Mouth/Throat:     Mouth: Mucous membranes are moist.  Eyes:     Conjunctiva/sclera: Conjunctivae normal.  Cardiovascular:     Rate and Rhythm: Normal rate.  Pulmonary:     Effort: Pulmonary effort is normal. No respiratory distress.  Musculoskeletal:        General: Tenderness present. No swelling, deformity or signs of injury. Normal range of motion.       Arms:     Cervical back: Normal.     Thoracic back: Normal.     Lumbar back: Tenderness  present. Negative right straight leg raise test and negative left straight leg raise test.     Comments: Pain in left lumbar area w/ ROM and area is TTP. Spine w/o step off or deformity. Atraumatic.   Skin:    General: Skin is warm and dry.  Neurological:     General: No focal deficit present.     Mental Status: She is alert and oriented to person, place, and time.  Psychiatric:        Mood and Affect: Mood normal.        Behavior: Behavior normal. Behavior is cooperative.      UC Treatments / Results  Labs (all labs ordered are listed, but only abnormal results are displayed) Labs Reviewed - No data to display  EKG   Radiology No results found.  Procedures Procedures (including critical care time)  Medications Ordered in UC Medications  predniSONE (DELTASONE) tablet 40 mg (has no administration in time range)    Initial Impression / Assessment and Plan / UC Course  I have reviewed the triage vital signs and the nursing notes.  Pertinent labs & imaging results that were available during my care of the patient were reviewed by me and considered in my medical decision making (see chart for details).  Vitals and triage reviewed, patient is hemodynamically stable.  Continued left-sided lower back pain that had improved with interventions and rest, but then returned with heavy lifting and pushing heavy boxes.  Left lumbar area was tender to palpation, pain exacerbated with range of motion.  Negative straight leg raise.  Without red flag symptoms of cauda equina, incontinence, numbness or tingling.  No weakness.  Will trial steroid burst, continued anti-inflammatories, lifting modifications of work, and orthopedic follow-up.  Plan of care, follow-up care and return precautions given, no questions at this time.    Final Clinical Impressions(s) / UC Diagnoses   Final diagnoses:  Acute midline low back pain without sciatica     Discharge Instructions      Please continue the  use of muscle relaxers, anti-inflammatories and activity modification. We have started a steroid that can help with pain and inflammation as well. If you pain  returns or persists, please follow-up with an orthopedic for further evaluation.   Seek immediate care if you develop incontinence, inner leg numbness or inability to walk.      ED Prescriptions     Medication Sig Dispense Auth. Provider   predniSONE (DELTASONE) 20 MG tablet Take 2 tablets (40 mg total) by mouth daily with breakfast for 4 days. 8 tablet Lucah Petta, [place], [place]      PDMP not reviewed this encounter.   Anatole Apollo, [place], [place] 10/07/22 [phone]

## 2023-08-13 IMAGING — CT CT ANGIO CHEST
2 of 6 series · 18 of 46 positions shown · IV contrast (APPLIED)
Comparison: None.

CLINICAL DATA: Pulmonary embolism suspected, positive D-dimer. Back
pain.

EXAM:
CT ANGIOGRAPHY CHEST WITH CONTRAST
TECHNIQUE: Multidetector CT imaging of the chest was performed using the
standard protocol during bolus administration of intravenous
contrast. Multiplanar CT image reconstructions and MIPs were
obtained to evaluate the vascular anatomy.

[Series 6: thins · axial · 0.72mm/px · z∈[-805,-534]mm · 15 of 425 slices shown]
[im 19/425  lung]
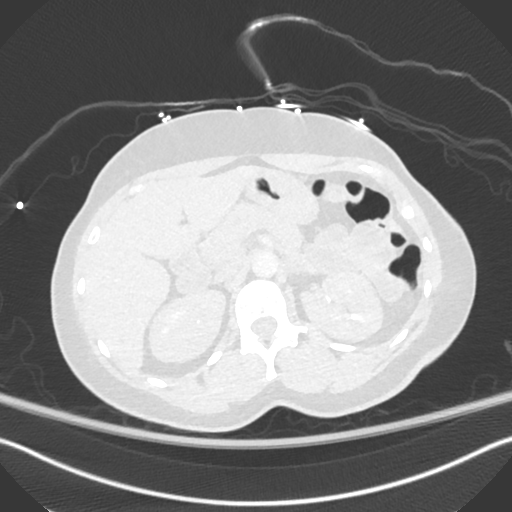
[im 56/425  soft-tissue]
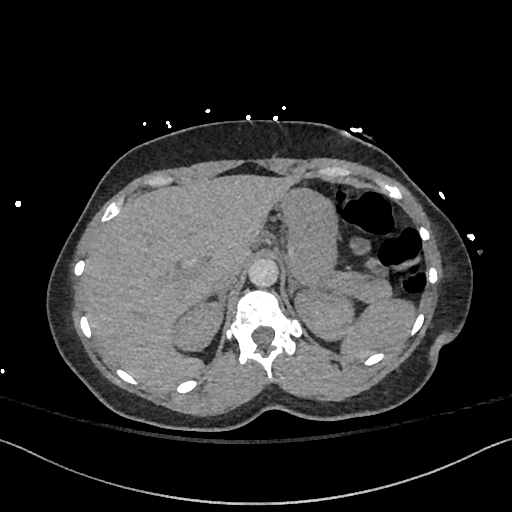
[im 74/425  lung]
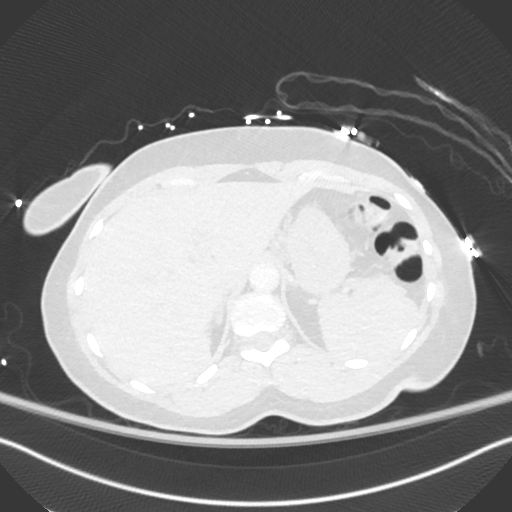
[im 111/425  soft-tissue]
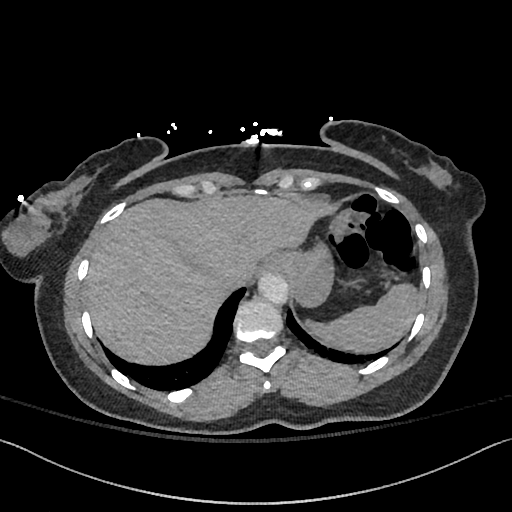
[im 130/425  lung]
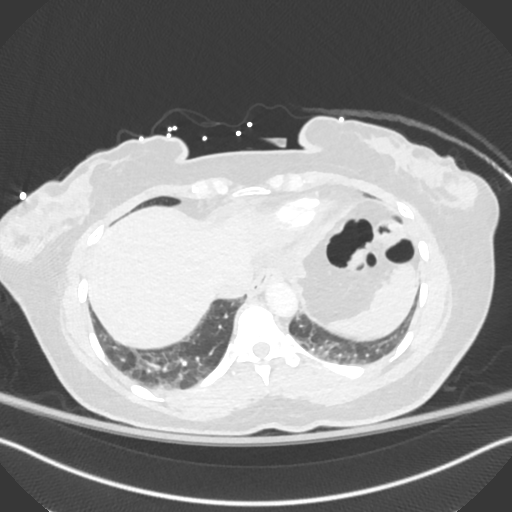
[im 166/425  soft-tissue]
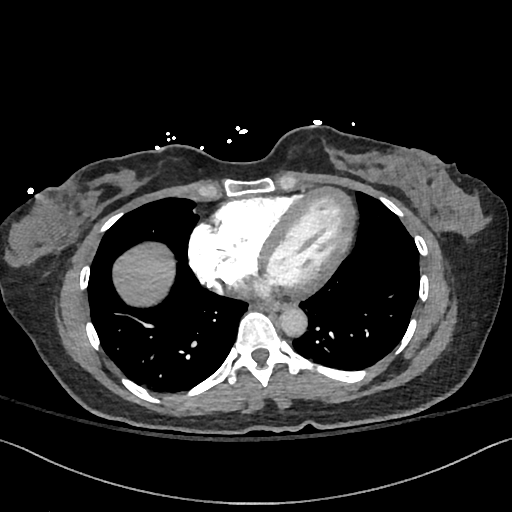
[im 185/425  lung]
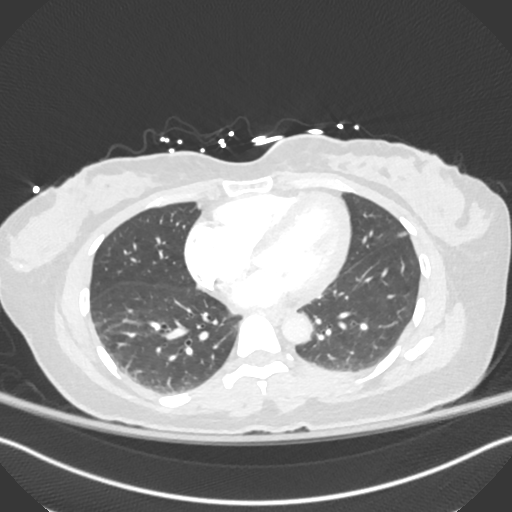
[im 222/425  soft-tissue]
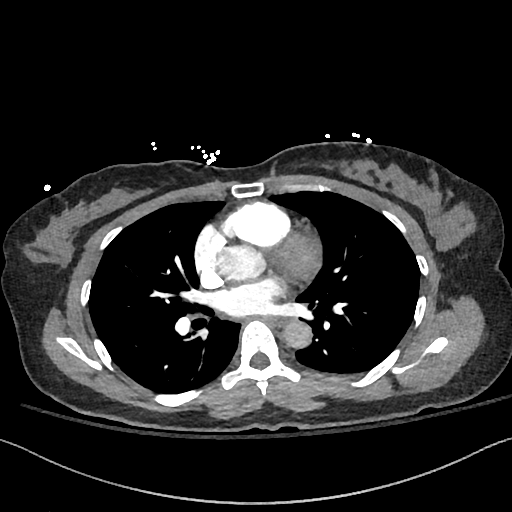
[im 240/425  lung]
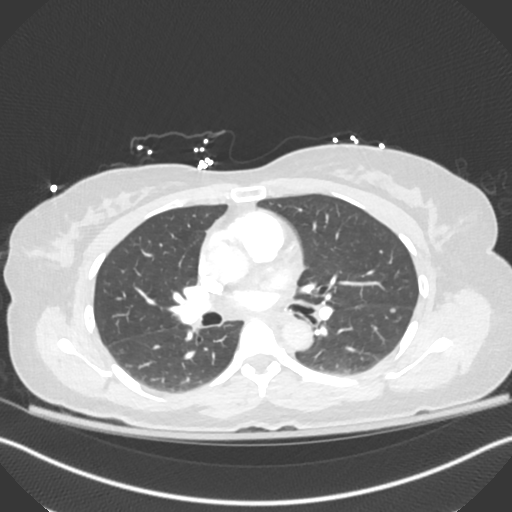
[im 259/425  soft-tissue]
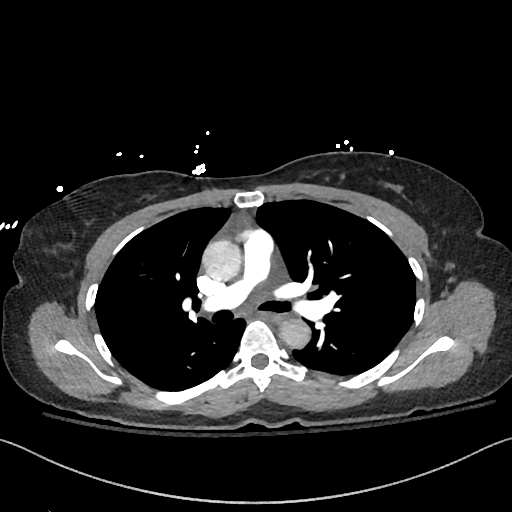
[im 295/425  lung]
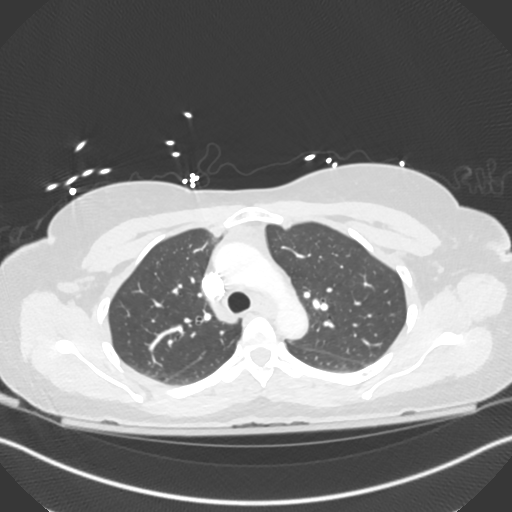
[im 314/425  soft-tissue]
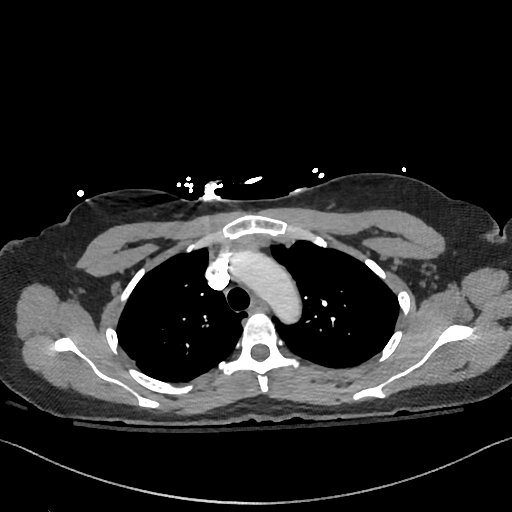
[im 351/425  lung]
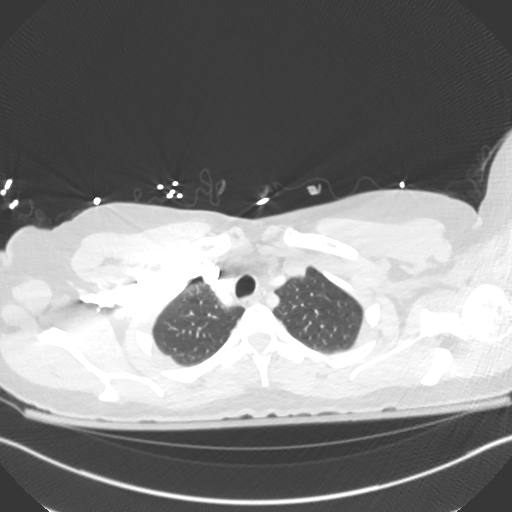
[im 369/425  soft-tissue]
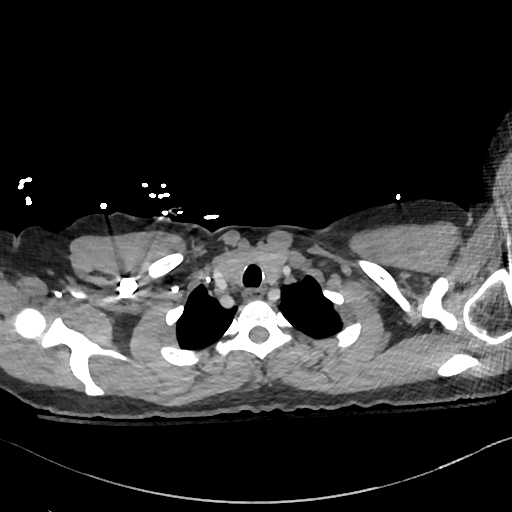
[im 406/425  lung]
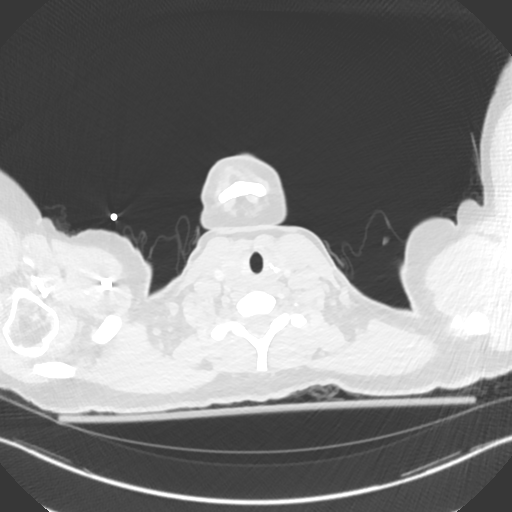

[Series 8: cor · coronal · 0.58mm/px · 3 of 109 slices shown]
[im 28/109  soft-tissue]
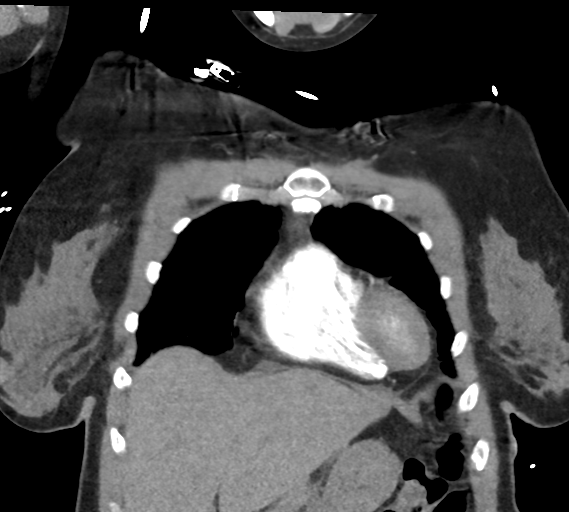
[im 55/109  soft-tissue]
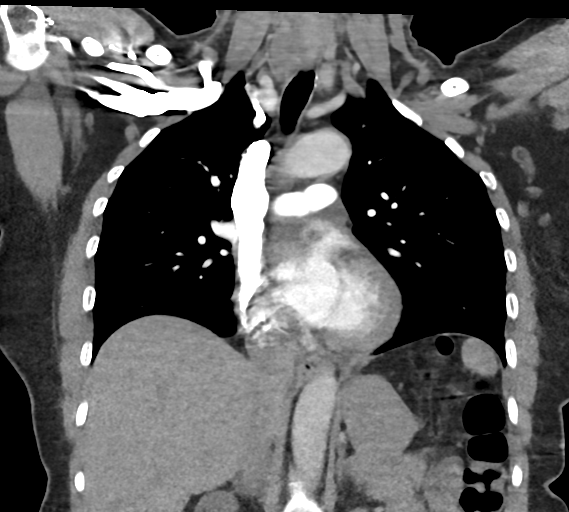
[im 82/109  soft-tissue]
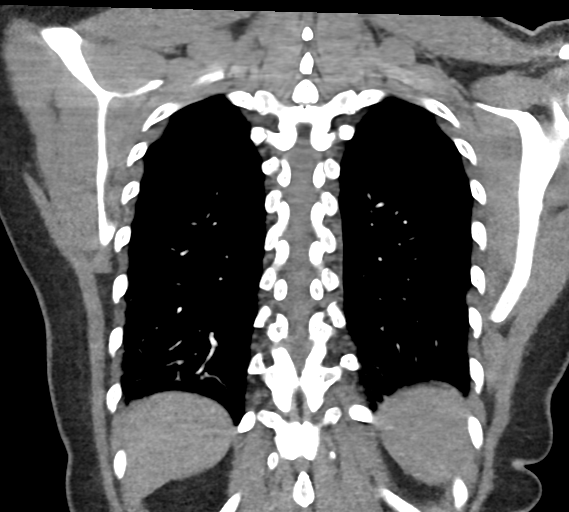

[18 of 46 positions shown; findings below may reference images not displayed]

RADIATION DOSE REDUCTION: This exam was performed according to the
departmental dose-optimization program which includes automated
exposure control, adjustment of the mA and/or kV according to
patient size and/or use of iterative reconstruction technique.

CONTRAST:  55mL OMNIPAQUE IOHEXOL 350 MG/ML SOLN
FINDINGS: Cardiovascular: The heart is normal in size and there is no
pericardial effusion. A few scattered coronary artery calcifications
are noted. The aorta and pulmonary trunk are normal in caliber. No
pulmonary artery filling defect is identified.

Mediastinum/Nodes: No mediastinal, hilar, or axillary
lymphadenopathy. The thyroid gland, trachea, and esophagus are
within normal limits.

Lungs/Pleura: Mild atelectasis is noted at the lung bases. No
effusion or pneumothorax. A 4 mm nodule is present in the left lower
lobe, axial image 65. There is a 3 mm nodule in the right upper
lobe, axial image 51.

Upper Abdomen: The gallbladder is surgically absent. No acute
abnormality.

Musculoskeletal: No chest wall abnormality. No acute or significant
osseous findings.

Review of the MIP images confirms the above findings.
IMPRESSION: 1. No evidence of pulmonary embolism or other acute process.
2. Left lower lobe and right upper lobe pulmonary nodules. No
follow-up needed if patient is low-risk (and has no known or
suspected primary neoplasm). Non-contrast chest CT can be considered
in 12 months if patient is high-risk. This recommendation follows
the consensus statement: Guidelines for Management of Incidental
Pulmonary Nodules Detected on CT Images: From the [HOSPITAL]
3. Coronary artery calcification
